# Patient Record
Sex: Male | Born: 1985 | Race: Asian | Hispanic: No | Marital: Single | Smoking: Current every day smoker
Health system: Southern US, Community
[De-identification: ages and names within clinical notes are randomized; demographics above are authoritative.]

---

## 2017-08-04 ENCOUNTER — Encounter: Payer: Self-pay | Admitting: Emergency Medicine

## 2017-08-04 ENCOUNTER — Emergency Department: Payer: Self-pay

## 2017-08-04 ENCOUNTER — Other Ambulatory Visit: Payer: Self-pay

## 2017-08-04 ENCOUNTER — Emergency Department
Admission: EM | Admit: 2017-08-04 | Discharge: 2017-08-04 | Disposition: A | Discharge status: Home / Self Care | Payer: Self-pay | Attending: Student in an Organized Health Care Education/Training Program | Admitting: Student in an Organized Health Care Education/Training Program

## 2017-08-04 DIAGNOSIS — R509 Fever, unspecified: Secondary | ICD-10-CM | POA: Insufficient documentation

## 2017-08-04 DIAGNOSIS — J101 Influenza due to other identified influenza virus with other respiratory manifestations: Secondary | ICD-10-CM | POA: Insufficient documentation

## 2017-08-04 DIAGNOSIS — F17210 Nicotine dependence, cigarettes, uncomplicated: Secondary | ICD-10-CM | POA: Insufficient documentation

## 2017-08-04 LAB — URINALYSIS, COMPLETE (UACMP) WITH MICROSCOPIC
BILIRUBIN URINE: NEGATIVE
Bacteria, UA: NONE SEEN
GLUCOSE, UA: 50 mg/dL — AB
HGB URINE DIPSTICK: NEGATIVE
KETONES UR: 5 mg/dL — AB
LEUKOCYTES UA: NEGATIVE
NITRITE: NEGATIVE
PROTEIN: NEGATIVE mg/dL
Specific Gravity, Urine: 1.021 (ref 1.005–1.030)
Squamous Epithelial / LPF: NONE SEEN (ref 0–5)
pH: 5 (ref 5.0–8.0)

## 2017-08-04 LAB — CBC WITH DIFFERENTIAL/PLATELET
BASOS ABS: 0 10*3/uL (ref 0–0.1)
BASOS PCT: 0 %
EOS ABS: 0 10*3/uL (ref 0–0.7)
Eosinophils Relative: 0 %
HCT: 44.8 % (ref 40.0–52.0)
HEMOGLOBIN: 15.7 g/dL (ref 13.0–18.0)
Lymphocytes Relative: 13 %
Lymphs Abs: 0.6 10*3/uL — ABNORMAL LOW (ref 1.0–3.6)
MCH: 31.1 pg (ref 26.0–34.0)
MCHC: 35 g/dL (ref 32.0–36.0)
MCV: 88.8 fL (ref 80.0–100.0)
MONO ABS: 1.1 10*3/uL — AB (ref 0.2–1.0)
MONOS PCT: 23 %
NEUTROS PCT: 64 %
Neutro Abs: 3 10*3/uL (ref 1.4–6.5)
Platelets: 211 10*3/uL (ref 150–440)
RBC: 5.05 MIL/uL (ref 4.40–5.90)
RDW: 13 % (ref 11.5–14.5)
WBC: 4.8 10*3/uL (ref 3.8–10.6)

## 2017-08-04 LAB — COMPREHENSIVE METABOLIC PANEL
ALBUMIN: 3.9 g/dL (ref 3.5–5.0)
ALT: 25 U/L (ref 0–44)
ANION GAP: 7 (ref 5–15)
AST: 30 U/L (ref 15–41)
Alkaline Phosphatase: 51 U/L (ref 38–126)
BUN: 16 mg/dL (ref 6–20)
CHLORIDE: 99 mmol/L (ref 98–111)
CO2: 28 mmol/L (ref 22–32)
Calcium: 8.3 mg/dL — ABNORMAL LOW (ref 8.9–10.3)
Creatinine, Ser: 0.99 mg/dL (ref 0.61–1.24)
GFR calc Af Amer: >60 mL/min (ref >60)
GFR calc non Af Amer: >60 mL/min (ref >60)
GLUCOSE: 126 mg/dL — AB (ref 70–99)
POTASSIUM: 3.6 mmol/L (ref 3.5–5.1)
SODIUM: 134 mmol/L — AB (ref 135–145)
Total Bilirubin: 0.5 mg/dL (ref 0.3–1.2)
Total Protein: 7 g/dL (ref 6.5–8.1)

## 2017-08-04 LAB — INFLUENZA PANEL BY PCR (TYPE A & B)
Influenza A By PCR: POSITIVE — AB
Influenza B By PCR: NEGATIVE

## 2017-08-04 MED ORDER — OSELTAMIVIR PHOSPHATE 75 MG PO CAPS: 75.0000 mg | ORAL_CAPSULE | Freq: Two times a day (BID) | ORAL | 0 refills | Status: AC

## 2017-08-04 NOTE — ED Provider Notes (Signed)
[facility] Emergency Department Provider Note    First MD Initiated Contact with Patient 08/04/17 1106     (approximate)  I have reviewed the triage vital signs and the nursing notes.   HISTORY  Chief Complaint Fever    HPI Philip Marks is a 32 y.o. male recent travel from [place].  Patient does live here locally.  Spent over 1 week in [place] traveling to few show and [place].  Return here on Saturday and since then has been having fevers chills and productive cough.  States he is up-to-date on his vaccinations.  Did recently start azithromycin last night.  Has not noticed any improvement.  Denies any abdominal pain.  No low or extremity swelling.  No nausea or vomiting.  No headaches or neck stiffness.    History reviewed. No pertinent past medical history. No family history on file. History reviewed. No pertinent surgical history. There are no active problems to display for this patient.     Prior to Admission medications   Medication Sig Start Date End Date Taking? Authorizing Provider  azithromycin (ZITHROMAX) 250 MG tablet Take 250-500 mg by mouth daily. Take 500 mg by mouth on the first day, then take 250 mg by mouth daily for 4 days until complete.   Yes [provider]  oseltamivir (TAMIFLU) 75 MG capsule Take 1 capsule (75 mg total) by mouth 2 (two) times daily for 5 days. 08/04/17 08/09/17  Willy Eddy, MD    Allergies Patient has no known allergies.    Social History Social History   Tobacco Use  . Smoking status: Current Every Day Smoker    Packs/day: 0.50    Types: Cigarettes  . Smokeless tobacco: Never Used  Substance Use Topics  . Alcohol use: Not on file  . Drug use: Not on file    Review of Systems Patient denies headaches, rhinorrhea, blurry vision, numbness, shortness of breath, chest pain, edema, cough, abdominal pain, nausea, vomiting, diarrhea, dysuria, fevers, rashes or hallucinations unless otherwise  stated above in HPI. ____________________________________________   PHYSICAL EXAM:  VITAL SIGNS: Vitals:   08/04/17 1201 08/04/17 1245  BP:    Pulse:  96  Resp: 16   Temp: 99.8 F (37.7 C)   SpO2:  97%    Constitutional: Alert and oriented.  Eyes: Conjunctivae are normal.  Head: Atraumatic. Nose: No congestion/rhinnorhea. Mouth/Throat: Mucous membranes are moist.   Neck: No stridor. Painless ROM.  Cardiovascular: Normal rate, regular rhythm. Grossly normal heart sounds.  Good peripheral circulation. Respiratory: Normal respiratory effort.  No retractions. Lungs CTAB. Gastrointestinal: Soft and nontender. No distention. No abdominal bruits. No CVA tenderness. Genitourinary:  Musculoskeletal: No lower extremity tenderness nor edema.  No joint effusions. Neurologic:  Normal speech and language. No gross focal neurologic deficits are appreciated. No facial droop Skin:  Skin is warm, dry and intact. No rash noted. Psychiatric: Mood and affect are normal. Speech and behavior are normal.  ____________________________________________   LABS (all labs ordered are listed, but only abnormal results are displayed)  Results for orders placed or performed during the hospital encounter of 08/04/17 (from the past 24 hour(s))  CBC with Differential/Platelet     Status: Abnormal   Collection Time: 08/04/17 11:27 AM  Result Value Ref Range   WBC 4.8 3.8 - 10.6 K/uL   RBC 5.05 4.40 - 5.90 MIL/uL   Hemoglobin 15.7 13.0 - 18.0 g/dL   HCT 16.1 09.6 - 04.5 %   MCV 88.8 80.0 -  100.0 fL   MCH 31.1 26.0 - 34.0 pg   MCHC 35.0 32.0 - 36.0 g/dL   RDW 09.813.0 11.911.5 - 14.714.5 %   Platelets 211 150 - 440 K/uL   Neutrophils Relative % 64 %   Neutro Abs 3.0 1.4 - 6.5 K/uL   Lymphocytes Relative 13 %   Lymphs Abs 0.6 (L) 1.0 - 3.6 K/uL   Monocytes Relative 23 %   Monocytes Absolute 1.1 (H) 0.2 - 1.0 K/uL   Eosinophils Relative 0 %   Eosinophils Absolute 0.0 0 - 0.7 K/uL   Basophils Relative 0 %    Basophils Absolute 0.0 0 - 0.1 K/uL  Comprehensive metabolic panel     Status: Abnormal   Collection Time: 08/04/17 11:27 AM  Result Value Ref Range   Sodium 134 (L) 135 - 145 mmol/L   Potassium 3.6 3.5 - 5.1 mmol/L   Chloride 99 98 - 111 mmol/L   CO2 28 22 - 32 mmol/L   Glucose, Bld 126 (H) 70 - 99 mg/dL   BUN 16 6 - 20 mg/dL   Creatinine, Ser 8.290.99 0.61 - 1.24 mg/dL   Calcium 8.3 (L) 8.9 - 10.3 mg/dL   Total Protein 7.0 6.5 - 8.1 g/dL   Albumin 3.9 3.5 - 5.0 g/dL   AST 30 15 - 41 U/L   ALT 25 0 - 44 U/L   Alkaline Phosphatase 51 38 - 126 U/L   Total Bilirubin 0.5 0.3 - 1.2 mg/dL   GFR calc non Af Amer >60 >60 mL/min   GFR calc Af Amer >60 >60 mL/min   Anion gap 7 5 - 15  Urinalysis, Complete w Microscopic     Status: Abnormal   Collection Time: 08/04/17 11:58 AM  Result Value Ref Range   Color, Urine YELLOW (A) YELLOW   APPearance CLEAR (A) CLEAR   Specific Gravity, Urine 1.021 1.005 - 1.030   pH 5.0 5.0 - 8.0   Glucose, UA 50 (A) NEGATIVE mg/dL   Hgb urine dipstick NEGATIVE NEGATIVE   Bilirubin Urine NEGATIVE NEGATIVE   Ketones, ur 5 (A) NEGATIVE mg/dL   Protein, ur NEGATIVE NEGATIVE mg/dL   Nitrite NEGATIVE NEGATIVE   Leukocytes, UA NEGATIVE NEGATIVE   RBC / HPF 0-5 0 - 5 RBC/hpf   WBC, UA 6-10 0 - 5 WBC/hpf   Bacteria, UA NONE SEEN NONE SEEN   Squamous Epithelial / LPF NONE SEEN 0 - 5   Mucus PRESENT   Influenza panel by PCR (type A & B)     Status: Abnormal   Collection Time: 08/04/17 12:02 PM  Result Value Ref Range   Influenza A By PCR POSITIVE (A) NEGATIVE   Influenza B By PCR NEGATIVE NEGATIVE   ____________________________________________ ____________________________________________  RADIOLOGY  I personally reviewed all radiographic images ordered to evaluate for the above acute complaints and reviewed radiology reports and findings.  These findings were personally discussed with the patient.  Please see medical record for radiology  report.  ____________________________________________   PROCEDURES  Procedure(s) performed:  Procedures    Critical Care performed: no ____________________________________________   INITIAL IMPRESSION / ASSESSMENT AND PLAN / ED COURSE  Pertinent labs & imaging results that were available during my care of the patient were reviewed by me and considered in my medical decision making (see chart for details).   DDX: pna, cap, tb, malaria, viral illness, pe  Mearl Benard HalstedShi is a 32 y.o. who presents to the ED with fever cough and symptoms as  described above.  Patient hemodynamically stable.  No hypoxia or tachycardia.  He is low risk by Wells criteria and is PERC negative.  Blood work and radiographs will be sent for the above differential.  Clinical Course as of Aug 05 1306  Tue Aug 04, 2017  1212 Patient reassessed.  Remains well-appearing.  On further questioning he did not travel outside of the city was primarily in the hotel while in the city.  Did not have any encounters with any livestock.  No animal bites.   [PR]  1301 Patient with flu a positive.  Will start Tamiflu.  Discussed case with patient.  Stable for outpatient follow-up.   [PR]    Clinical Course User Index [PR] Willy Eddy, MD     As part of my medical decision making, I reviewed the following data within the electronic MEDICAL RECORD NUMBER Nursing notes reviewed and incorporated, Labs reviewed, notes from prior ED visits and [place] Controlled Substance Database   ____________________________________________   FINAL CLINICAL IMPRESSION(S) / ED DIAGNOSES  Final diagnoses:  Fever, unspecified fever cause  Influenza A      NEW MEDICATIONS STARTED DURING THIS VISIT:  New Prescriptions   OSELTAMIVIR (TAMIFLU) 75 MG CAPSULE    Take 1 capsule (75 mg total) by mouth 2 (two) times daily for 5 days.     Note:  This document was prepared using Dragon voice recognition software and may include unintentional  dictation errors.    Willy Eddy, MD 08/04/17 1308

## 2017-08-04 NOTE — ED Triage Notes (Addendum)
Pt arrives via POV from home with c/o fever since Sunday. States he has been coughing. Reports occasional yellow thick sputum. Azithromycin course started last night. Family member gave it to him; no doctor prescription. Some chest pain. Reports it only hurts when he coughs it feels like an "muscle ache." Also c/o dry throat.  [employer] Interpreter present via iPad in triage ([employer]): Jazz [phone]#[phone].

## 2017-08-04 NOTE — ED Notes (Signed)
Pt moved to room 1  [facility]

## 2017-08-04 NOTE — ED Notes (Addendum)
See triage note  States he developed prod cough a few days ago  States cough is productive  And also has had fever  Febrile on arrival recently returned for [place] 3 days ago  [place] one in [place] [facility]  States he had some vomiting yesterday d/t cough currently taking z-pak which was given to him by family  Also having some muscle aches   Mask placed on pt

## 2017-08-04 NOTE — ED Provider Notes (Signed)
[facility] Emergency Department Provider Note  ____________________________________________   First MD Initiated Contact with Patient 08/04/17 1029     (approximate)  I have reviewed the triage vital signs and the nursing notes.   HISTORY  Chief Complaint Fever    HPI Philip Marks is a 32 y.o. male presents emergency department complaining of fever, cough, and some chest pain with cough.  He had 1-2 episodes of vomiting yesterday that were associated with cough.  He had recent travel to [place].  He just returned on Saturday.  His flight was 15 hours long.  He states he was given Zithromax by his family.  He had 2 pills yesterday and has had none today.    History reviewed. No pertinent past medical history.  There are no active problems to display for this patient.   History reviewed. No pertinent surgical history.  Prior to Admission medications   Medication Sig Start Date End Date Taking? Authorizing Provider  azithromycin (ZITHROMAX) 250 MG tablet Take 250 mg by mouth daily.   Yes [provider]    Allergies Patient has no known allergies.  No family history on file.  Social History Social History   Tobacco Use  . Smoking status: Current Every Day Smoker    Packs/day: 0.50    Types: Cigarettes  . Smokeless tobacco: Never Used  Substance Use Topics  . Alcohol use: Not on file  . Drug use: Not on file    Review of Systems  Constitutional: Positive fever/chills Eyes: No visual changes. ENT: No sore throat. Respiratory: Positive cough Cardiovascular: Positive for some chest pain but with the cough  genitourinary: Negative for dysuria. Musculoskeletal: Negative for back pain. Skin: Negative for rash.    ____________________________________________   PHYSICAL EXAM:  VITAL SIGNS: ED Triage Vitals  Enc Vitals Group     BP 08/04/17 0954 131/79     Pulse Rate 08/04/17 0954 99     Resp 08/04/17 0954 16     Temp  08/04/17 0954 (!) 100.5 F (38.1 C)     Temp Source 08/04/17 0954 Oral     SpO2 08/04/17 0954 96 %     Weight 08/04/17 0958 125 lb (56.7 kg)     Height 08/04/17 0958 5' 5.35" (1.66 m)     Head Circumference --      Peak Flow --      Pain Score 08/04/17 0959 0     Pain Loc --      Pain Edu? --      Excl. in GC? --     Constitutional: Alert and oriented. Well appearing and in no acute distress.  Patient is febrile Eyes: Conjunctivae are normal.  Head: Atraumatic. Nose: No congestion/rhinnorhea. Mouth/Throat: Mucous membranes are moist.  Throat appears normal Neck:  supple no lymphadenopathy noted Cardiovascular: Normal rate, regular rhythm. Heart sounds are normal Respiratory: Normal respiratory effort.  No retractions, lungs c t a  Abd: soft nontender bs normal all 4 quad GU: deferred Musculoskeletal: FROM all extremities, warm and well perfused Neurologic:  Normal speech and language.  Skin:  Skin is warm, dry and intact. No rash noted. Psychiatric: Mood and affect are normal. Speech and behavior are normal.  ____________________________________________   LABS (all labs ordered are listed, but only abnormal results are displayed)  Labs Reviewed  CBC WITH DIFFERENTIAL/PLATELET  COMPREHENSIVE METABOLIC PANEL   ____________________________________________   ____________________________________________  RADIOLOGY    ____________________________________________   PROCEDURES  Procedure(s) performed: No  Procedures    ____________________________________________   INITIAL IMPRESSION / ASSESSMENT AND PLAN / ED COURSE  Pertinent labs & imaging results that were available during my care of the patient were reviewed by me and considered in my medical decision making (see chart for details).   Patient is 32 year old Asian male complaining of cough and fever.  Patient had recent travel to [place].  He returned on Saturday.  Flights 15 hours.  [place] [address].  He did not come into contact with anyone that had SARS.  He denies any insect bites.  Due to the recent travel to [place], fever and cough I discussed case with Dr. Roxan Hockeyobinson.  He agrees and feels that the patient should be on the major side.  The patient was moved to room 1.  We applied a mask to the patient.  The patient had been through the emergency department and triaged and was not given a mask.       As part of my medical decision making, I reviewed the following data within the electronic MEDICAL RECORD NUMBER Nursing notes reviewed and incorporated, Evaluated by EM attending Roxan Hockeyobinson, Notes from prior ED visits and [place] Controlled Substance Database  ____________________________________________   FINAL CLINICAL IMPRESSION(S) / ED DIAGNOSES  Final diagnoses:  Fever, unspecified fever cause      NEW MEDICATIONS STARTED DURING THIS VISIT:  New Prescriptions   No medications on file     Note:  This document was prepared using Dragon voice recognition software and may include unintentional dictation errors.    [name], Susan W, PA-C 08/04/17 1109    [name], Patrick, MD 08/10/17 2003

## 2017-08-04 NOTE — ED Notes (Signed)
D/c paperwork with interpreter on stick

## 2017-08-04 NOTE — ED Notes (Signed)
Tissue test completed for negative pressure room

## 2019-07-20 IMAGING — DX DG CHEST 1V PORT
1 series · 1 of 1 positions shown · non-contrast
Comparison: None.

CLINICAL DATA: Productive cough

EXAM:
PORTABLE CHEST 1 VIEW

[chest ap]
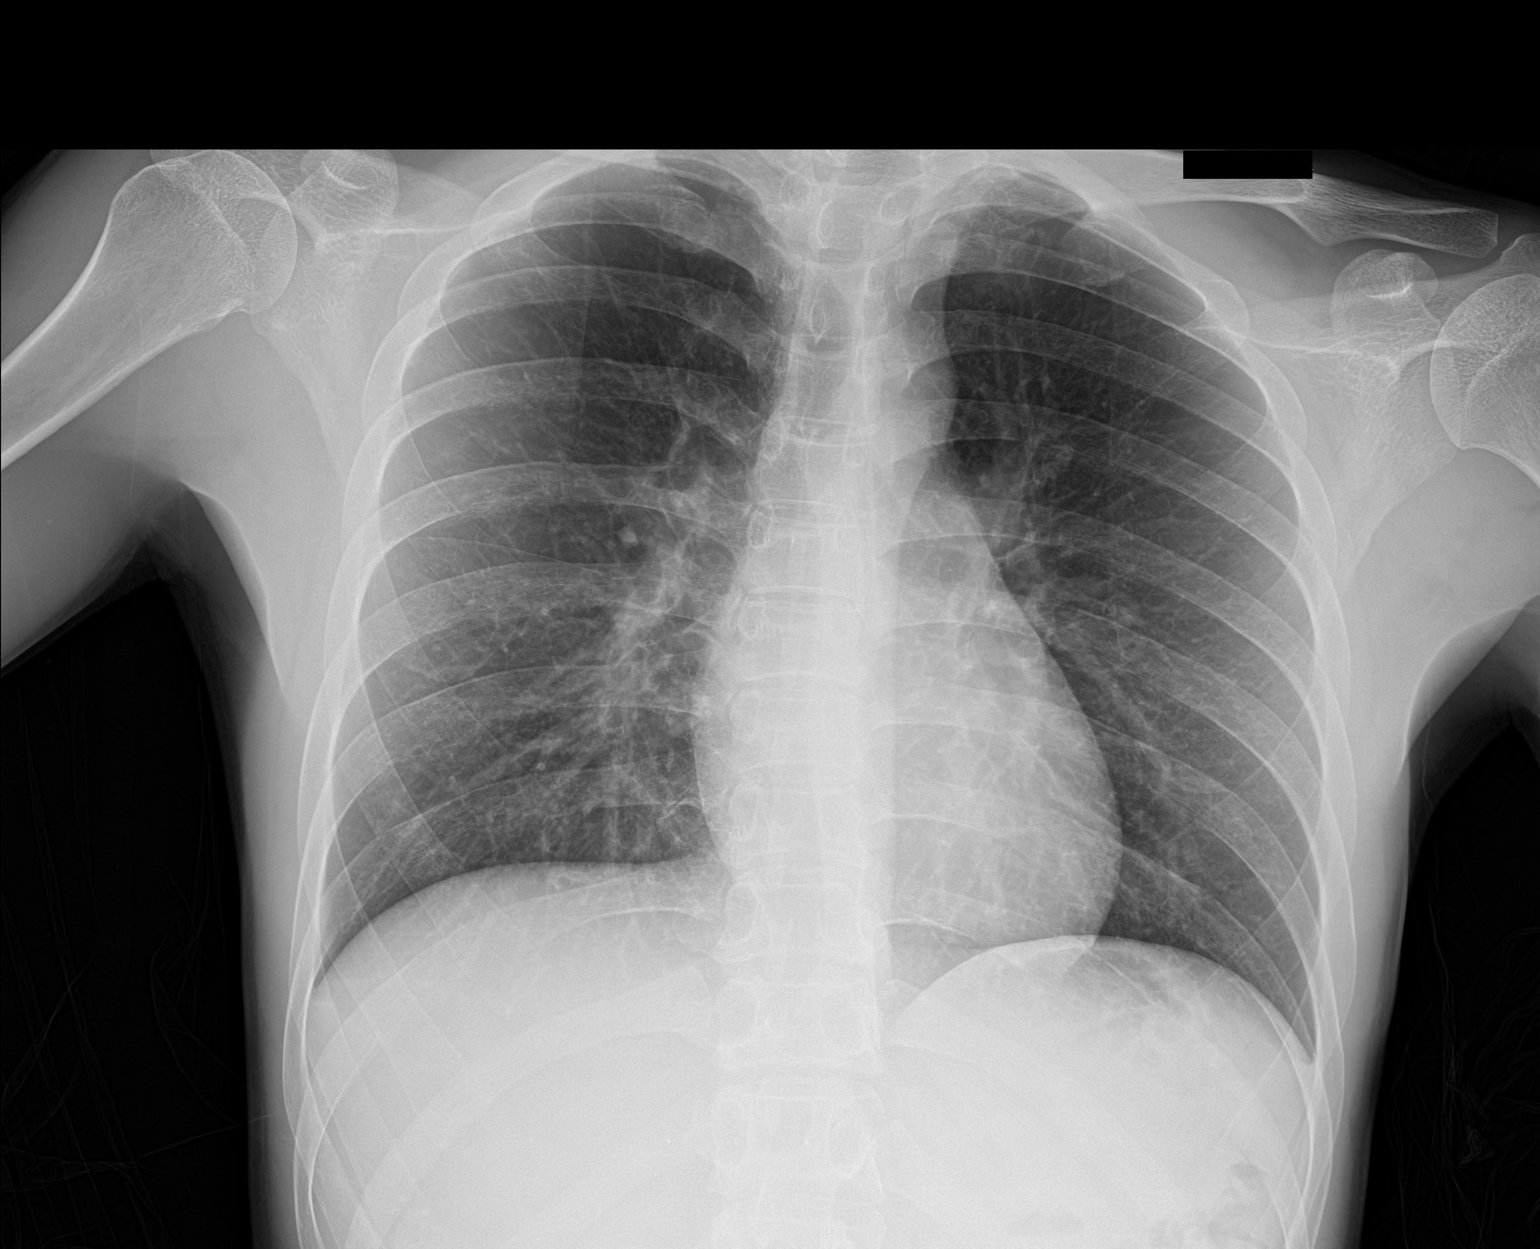

[1 of 1 positions shown; findings below may reference images not displayed]

FINDINGS: The heart size and mediastinal contours are within normal limits.
Both lungs are clear. The visualized skeletal structures are
unremarkable.
IMPRESSION: No active disease.
